# Patient Record
Sex: Male | Born: 1960 | Race: White | Hispanic: No | Marital: Married | Smoking: Never smoker
Health system: Southern US, Community
[De-identification: ages and names within clinical notes are randomized; demographics above are authoritative.]

## PROBLEM LIST (undated history)

## (undated) DIAGNOSIS — F32A Depression, unspecified: Secondary | ICD-10-CM

## (undated) DIAGNOSIS — M479 Spondylosis, unspecified: Secondary | ICD-10-CM

## (undated) DIAGNOSIS — M1990 Unspecified osteoarthritis, unspecified site: Secondary | ICD-10-CM

## (undated) DIAGNOSIS — F419 Anxiety disorder, unspecified: Secondary | ICD-10-CM

## (undated) DIAGNOSIS — K219 Gastro-esophageal reflux disease without esophagitis: Secondary | ICD-10-CM

## (undated) DIAGNOSIS — G5620 Lesion of ulnar nerve, unspecified upper limb: Secondary | ICD-10-CM

## (undated) DIAGNOSIS — I10 Essential (primary) hypertension: Secondary | ICD-10-CM

## (undated) DIAGNOSIS — F329 Major depressive disorder, single episode, unspecified: Secondary | ICD-10-CM

## (undated) HISTORY — PX: HERNIA REPAIR: SHX51

## (undated) HISTORY — PX: COLONOSCOPY: SHX174

## (undated) HISTORY — PX: WISDOM TOOTH EXTRACTION: SHX21

## (undated) HISTORY — PX: BACK SURGERY: SHX140

---

## 2013-03-31 ENCOUNTER — Encounter (HOSPITAL_BASED_OUTPATIENT_CLINIC_OR_DEPARTMENT_OTHER): Payer: Self-pay | Admitting: *Deleted

## 2013-03-31 ENCOUNTER — Other Ambulatory Visit: Payer: Self-pay | Admitting: Orthopedic Surgery

## 2013-04-01 NOTE — H&P (Signed)
Jeffrey Cooke is an 52 y.o. male.   CC / Reason for Visit: Left shoulder and upper extremity problems HPI: This patient returns for reevaluation.  He reports that the pain remains anterior in his shoulder and down the biceps.  It was only minimally affected by the subacromial injection.  He continues to have ring and small finger numbness and tingling, worse in the small finger, which is painful, feeling as if there is needles within it.  He is taking hydrocodone 2 tablets every 4-6 hours, ibuprofen 800 mg 3 times a day, and ran out of Neurontin about a week ago.  He saw no change in his symptoms when he was on it versus off of it.  He underwent electrodiagnostic studies with Dr. 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  Presenting history follows:  This patient is a 52 year old male who presents for evaluation of his left upper extremity.  He reports he was in the process of removing a gear box on a piece of equipment when the motor began to fall.  He reached up overhead to catch the motor and felt a sharp pain in his shoulder and neck.  At first he thought he had just pulled a muscle, but over the course of a few days he began to have increasing pain in the shoulder with numbness and tingling in the fingers.  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  He was subsequently evaluated at [facility] on 01-13-13.  He has had ibuprofen 800 mg, but not taken any in the last 2 days.  Over the course of treatment he has also been prescribed Vicodin, Percocet, and Robaxin.  He reports incompatibility with some of those medicines and working.  He has been in a sling but reports that he had some numbness and tingling in the fingers even before using the sling.  He reports that even water running on the hand causes it to feel as if it is burning, regardless of the water temperature.  Past Medical History   Diagnosis Date  . Hypertension   . Anxiety   . Depression   . GERD (gastroesophageal reflux disease)   . Arthritis     hands and shoulders  . 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 Hernia repair      History reviewed. No pertinent family history. Social History:  reports that he has never smoked. His smokeless tobacco use includes Snuff. He reports that he drinks alcohol. He reports that he does not use illicit drugs.  Allergies: No Known Allergies  No prescriptions prior to admission    No results found for this or any previous visit (from the past 48 hour(s)). No results found.  Review of Systems  All other systems reviewed and are negative.    Height 5\' 10"  (1.778 m), weight 108.863 kg (240 lb). Physical Exam  Constitutional:  WD, WN, NAD HEENT:  NCAT, EOMI Neuro/Psych:  Alert & oriented to person, place, and time; appropriate mood & affect Lymphatic: No generalized UE edema or lymphadenopathy Extremities / MSK:  Both UE are normal with respect to appearance, ranges of motion, joint stability, muscle strength/tone, sensation, & perfusion except as otherwise noted:  The patient has reasonably good cervical motion without significant pain.  When he looks to the left and slightly upward he  has some pain that radiates from the base of the neck down into the shoulder.  Shoulder motion itself whether active or passive is much more painful.  Passively he allows forward elevation to 160, actively can hardly get beyond 90 he has a lot of pain and subsequent weakness with resisted external rotation and abduction, not as much with internal rotation.  Even passive movement of the shoulders painful little longer impingement maneuvers.  He has altered sensibility on the volar and dorsal aspects of the hand distal to the wrist creases.  This includes the ring and small finger with appropriate ring finger splitting for the ulnar nerve.   He has good interosseus and thenar strength.  Strong wrist flexion and extension, elbow flexion and extension.  Labs / Xrays:  No radiographic studies obtained today.  Left shoulder x-rays from 01-11-13 are reviewed and are unremarkable.  MRI of left shoulder performed on 02-08-13 reveals some tendinosis of the superior cuff, small partial thickness intrasubstance tear of the distal subscap, with longitudinal split tearing of the intra-articular biceps and mild medial subluxation at the lesser tuberosity.  There is also some thickening of the inferior GH ligament and rotator interval  Assessment: 1.  Left shoulder pain with MRI indication of pathology to the subscapularis tendon, biceps tendon and the bicipital labral root, with some possible adhesive capsulitis 2.  Left ulnar neuropathy, likely as a consequence of the left upper extremity swelling and splinting with the elbow flexed in a sling directly related to his shoulder injury  Plan:  I discussed these findings with him and his nurse case manager, Debby Bud.In consultation, we agreed that his ulnar neuropathy is largely a consequence of his injury, either because the nerve was directly injured in the course of the injury or became subsequently influenced by the upper extremity edema that accompanies shoulder injuries.  In addition, we would recommend that he have surgical decompression of the ulnar nerve performed first, followed by surgical treatment of his shoulder pathology some time 4 to 6 weeks following the elbow surgery.  We recommend that I provide surgical treatment for his ulnar nerve, and Dr. Ave Filter has agreed to assume care for the shoulder pathology.  Together, we can easily handle the issues at the conclusion of his total care, such as declaration of MMI, any PPI that may exist, as well as restrictions, etc.  I would anticipate that the contribution to these matters from the ulnar nerve would be negligible, and most of it would be  referable to the shoulder itself.  Dr. Ave Filter and I both agree that the nerve is likely to deteriorate substantially if the shoulder is operated upon first, as surgery on the shoulder will cause some prolonged upper extremity edema that will exacerbate the condition of the nerve.  I am willing and able to proceed promptly with surgical treatment of the ulnar nerve once authorization from workers compensation is received.  The patient understands this as I spoke with him on the telephone and explained the same issues in detail.  Rian Koon A. 04/01/2013, 11:57 AM

## 2013-04-05 ENCOUNTER — Encounter (HOSPITAL_BASED_OUTPATIENT_CLINIC_OR_DEPARTMENT_OTHER)
Admission: RE | Disposition: A | Discharge status: Home / Self Care | Payer: Self-pay | Source: Ambulatory Visit | Attending: Orthopedic Surgery

## 2013-04-05 ENCOUNTER — Ambulatory Visit (HOSPITAL_BASED_OUTPATIENT_CLINIC_OR_DEPARTMENT_OTHER): Payer: Worker's Compensation | Admitting: Anesthesiology

## 2013-04-05 ENCOUNTER — Encounter (HOSPITAL_BASED_OUTPATIENT_CLINIC_OR_DEPARTMENT_OTHER): Payer: Worker's Compensation | Admitting: Anesthesiology

## 2013-04-05 ENCOUNTER — Encounter (HOSPITAL_BASED_OUTPATIENT_CLINIC_OR_DEPARTMENT_OTHER): Payer: Self-pay | Admitting: *Deleted

## 2013-04-05 ENCOUNTER — Ambulatory Visit (HOSPITAL_BASED_OUTPATIENT_CLINIC_OR_DEPARTMENT_OTHER)
Admission: RE | Admit: 2013-04-05 | Discharge: 2013-04-05 | Disposition: A | Discharge status: Home / Self Care | Payer: Worker's Compensation | Source: Ambulatory Visit | Attending: Orthopedic Surgery | Admitting: Orthopedic Surgery

## 2013-04-05 DIAGNOSIS — F3289 Other specified depressive episodes: Secondary | ICD-10-CM | POA: Insufficient documentation

## 2013-04-05 DIAGNOSIS — I10 Essential (primary) hypertension: Secondary | ICD-10-CM | POA: Insufficient documentation

## 2013-04-05 DIAGNOSIS — F329 Major depressive disorder, single episode, unspecified: Secondary | ICD-10-CM | POA: Insufficient documentation

## 2013-04-05 DIAGNOSIS — K219 Gastro-esophageal reflux disease without esophagitis: Secondary | ICD-10-CM | POA: Insufficient documentation

## 2013-04-05 DIAGNOSIS — F17200 Nicotine dependence, unspecified, uncomplicated: Secondary | ICD-10-CM | POA: Insufficient documentation

## 2013-04-05 DIAGNOSIS — F411 Generalized anxiety disorder: Secondary | ICD-10-CM | POA: Insufficient documentation

## 2013-04-05 DIAGNOSIS — G5680 Other specified mononeuropathies of unspecified upper limb: Secondary | ICD-10-CM | POA: Insufficient documentation

## 2013-04-05 DIAGNOSIS — G5620 Lesion of ulnar nerve, unspecified upper limb: Secondary | ICD-10-CM | POA: Insufficient documentation

## 2013-04-05 HISTORY — DX: Essential (primary) hypertension: I10

## 2013-04-05 HISTORY — DX: Major depressive disorder, single episode, unspecified: F32.9

## 2013-04-05 HISTORY — DX: Anxiety disorder, unspecified: F41.9

## 2013-04-05 HISTORY — DX: Lesion of ulnar nerve, unspecified upper limb: G56.20

## 2013-04-05 HISTORY — DX: Gastro-esophageal reflux disease without esophagitis: K21.9

## 2013-04-05 HISTORY — PX: ULNAR NERVE TRANSPOSITION: SHX2595

## 2013-04-05 HISTORY — DX: Depression, unspecified: F32.A

## 2013-04-05 HISTORY — DX: Unspecified osteoarthritis, unspecified site: M19.90

## 2013-04-05 LAB — POCT I-STAT, CHEM 8
Creatinine, Ser: 0.9 mg/dL (ref 0.50–1.35)
Glucose, Bld: 95 mg/dL (ref 70–99)
HCT: 46 % (ref 39.0–52.0)
Hemoglobin: 15.6 g/dL (ref 13.0–17.0)
Potassium: 3.7 mEq/L (ref 3.5–5.1)
TCO2: 24 mmol/L (ref 0–100)

## 2013-04-05 SURGERY — ULNAR NERVE DECOMPRESSION/TRANSPOSITION
Anesthesia: General | Site: Elbow | Laterality: Left | Wound class: Clean

## 2013-04-05 MED ORDER — FENTANYL CITRATE 0.05 MG/ML IJ SOLN
INTRAMUSCULAR | Status: AC
  Filled 2013-04-05: qty 4

## 2013-04-05 MED ORDER — ONDANSETRON HCL 4 MG/2ML IJ SOLN: 4.0000 mg | Freq: Once | INTRAMUSCULAR | Status: DC | PRN

## 2013-04-05 MED ORDER — KETOROLAC TROMETHAMINE 30 MG/ML IJ SOLN: 15.0000 mg | Freq: Once | INTRAMUSCULAR | Status: DC | PRN

## 2013-04-05 MED ORDER — OXYCODONE-ACETAMINOPHEN 5-325 MG PO TABS: 1.0000 | ORAL_TABLET | ORAL | Status: DC | PRN

## 2013-04-05 MED ORDER — CEFAZOLIN SODIUM-DEXTROSE 2-3 GM-% IV SOLR
2.0000 g | INTRAVENOUS | Status: AC
  Administered 2013-04-05: 2 g via INTRAVENOUS

## 2013-04-05 MED ORDER — FENTANYL CITRATE 0.05 MG/ML IJ SOLN
INTRAMUSCULAR | Status: DC | PRN
  Administered 2013-04-05: 100 ug via INTRAVENOUS
  Administered 2013-04-05 (×2): 50 ug via INTRAVENOUS

## 2013-04-05 MED ORDER — LIDOCAINE HCL (CARDIAC) 20 MG/ML IV SOLN
INTRAVENOUS | Status: DC | PRN
  Administered 2013-04-05: 40 mg via INTRAVENOUS

## 2013-04-05 MED ORDER — BUPIVACAINE-EPINEPHRINE PF 0.5-1:200000 % IJ SOLN
INTRAMUSCULAR | Status: AC
  Filled 2013-04-05: qty 30

## 2013-04-05 MED ORDER — LACTATED RINGERS IV SOLN
INTRAVENOUS | Status: DC
  Administered 2013-04-05 (×2): via INTRAVENOUS

## 2013-04-05 MED ORDER — PROPOFOL 10 MG/ML IV BOLUS
INTRAVENOUS | Status: DC | PRN
  Administered 2013-04-05: 180 mg via INTRAVENOUS

## 2013-04-05 MED ORDER — HYDROMORPHONE HCL PF 1 MG/ML IJ SOLN
INTRAMUSCULAR | Status: AC
  Filled 2013-04-05: qty 1

## 2013-04-05 MED ORDER — LACTATED RINGERS IV SOLN
INTRAVENOUS | Status: DC
  Administered 2013-04-05: 08:00:00 via INTRAVENOUS

## 2013-04-05 MED ORDER — ONDANSETRON HCL 4 MG/2ML IJ SOLN
INTRAMUSCULAR | Status: DC | PRN
  Administered 2013-04-05: 4 mg via INTRAVENOUS

## 2013-04-05 MED ORDER — 0.9 % SODIUM CHLORIDE (POUR BTL) OPTIME
TOPICAL | Status: DC | PRN
  Administered 2013-04-05: 300 mL

## 2013-04-05 MED ORDER — MIDAZOLAM HCL 5 MG/5ML IJ SOLN
INTRAMUSCULAR | Status: DC | PRN
  Administered 2013-04-05: 2 mg via INTRAVENOUS

## 2013-04-05 MED ORDER — FENTANYL CITRATE 0.05 MG/ML IJ SOLN: 50.0000 ug | INTRAMUSCULAR | Status: DC | PRN

## 2013-04-05 MED ORDER — MIDAZOLAM HCL 2 MG/2ML IJ SOLN: 1.0000 mg | INTRAMUSCULAR | Status: DC | PRN

## 2013-04-05 MED ORDER — MIDAZOLAM HCL 2 MG/2ML IJ SOLN
INTRAMUSCULAR | Status: AC
  Filled 2013-04-05: qty 2

## 2013-04-05 MED ORDER — BUPIVACAINE-EPINEPHRINE PF 0.5-1:200000 % IJ SOLN
INTRAMUSCULAR | Status: DC | PRN
  Administered 2013-04-05: 20 mL

## 2013-04-05 MED ORDER — HYDROMORPHONE HCL PF 1 MG/ML IJ SOLN
0.2500 mg | INTRAMUSCULAR | Status: DC | PRN
  Administered 2013-04-05 (×2): 0.5 mg via INTRAVENOUS

## 2013-04-05 MED ORDER — DEXAMETHASONE SODIUM PHOSPHATE 10 MG/ML IJ SOLN
INTRAMUSCULAR | Status: DC | PRN
  Administered 2013-04-05: 5 mg via INTRAVENOUS

## 2013-04-05 SURGICAL SUPPLY — 48 items
BANDAGE GAUZE ELAST BULKY 4 IN (GAUZE/BANDAGES/DRESSINGS) ×4 IMPLANT
BLADE MINI RND TIP GREEN BEAV (BLADE) IMPLANT
BLADE SURG 15 STRL LF DISP TIS (BLADE) ×1 IMPLANT
BLADE SURG 15 STRL SS (BLADE) ×1
BNDG COHESIVE 4X5 TAN STRL (GAUZE/BANDAGES/DRESSINGS) ×2 IMPLANT
BNDG ESMARK 4X9 LF (GAUZE/BANDAGES/DRESSINGS) ×2 IMPLANT
CHLORAPREP W/TINT 26ML (MISCELLANEOUS) ×2 IMPLANT
CORDS BIPOLAR (ELECTRODE) IMPLANT
COVER MAYO STAND STRL (DRAPES) ×2 IMPLANT
COVER TABLE BACK 60X90 (DRAPES) ×2 IMPLANT
CUFF TOURNIQUET SINGLE 18IN (TOURNIQUET CUFF) ×2 IMPLANT
DRAIN PENROSE 1/2X12 LTX STRL (WOUND CARE) IMPLANT
DRAIN PENROSE 1/4X12 LTX STRL (WOUND CARE) IMPLANT
DRAPE EXTREMITY T 121X128X90 (DRAPE) ×2 IMPLANT
DRAPE SURG 17X23 STRL (DRAPES) ×4 IMPLANT
DRAPE U-SHAPE 47X51 STRL (DRAPES) IMPLANT
DRSG EMULSION OIL 3X3 NADH (GAUZE/BANDAGES/DRESSINGS) ×2 IMPLANT
ELECT REM PT RETURN 9FT ADLT (ELECTROSURGICAL) ×2
ELECTRODE REM PT RTRN 9FT ADLT (ELECTROSURGICAL) ×1 IMPLANT
GLOVE BIO SURGEON STRL SZ 6.5 (GLOVE) ×2 IMPLANT
GLOVE BIO SURGEON STRL SZ7.5 (GLOVE) ×2 IMPLANT
GLOVE BIOGEL PI IND STRL 7.0 (GLOVE) ×1 IMPLANT
GLOVE BIOGEL PI IND STRL 8 (GLOVE) ×1 IMPLANT
GLOVE BIOGEL PI INDICATOR 7.0 (GLOVE) ×1
GLOVE BIOGEL PI INDICATOR 8 (GLOVE) ×1
GLOVE EXAM NITRILE MD LF STRL (GLOVE) ×2 IMPLANT
GOWN PREVENTION PLUS XLARGE (GOWN DISPOSABLE) ×4 IMPLANT
LOOP VESSEL MAXI BLUE (MISCELLANEOUS) IMPLANT
NEEDLE HYPO 25X1 1.5 SAFETY (NEEDLE) ×2 IMPLANT
NS IRRIG 1000ML POUR BTL (IV SOLUTION) ×2 IMPLANT
PACK BASIN DAY SURGERY FS (CUSTOM PROCEDURE TRAY) ×2 IMPLANT
PADDING CAST ABS 4INX4YD NS (CAST SUPPLIES) ×2
PADDING CAST ABS COTTON 4X4 ST (CAST SUPPLIES) ×2 IMPLANT
PENCIL BUTTON HOLSTER BLD 10FT (ELECTRODE) ×2 IMPLANT
RUBBERBAND STERILE (MISCELLANEOUS) IMPLANT
SLING ARM FOAM STRAP LRG (SOFTGOODS) ×2 IMPLANT
SPONGE GAUZE 4X4 12PLY (GAUZE/BANDAGES/DRESSINGS) ×2 IMPLANT
STOCKINETTE 4X48 STRL (DRAPES) ×2 IMPLANT
SUT ETHILON 8 0 BV130 4 (SUTURE) IMPLANT
SUT VIC AB 0 SH 27 (SUTURE) IMPLANT
SUT VIC AB 2-0 SH 27 (SUTURE)
SUT VIC AB 2-0 SH 27XBRD (SUTURE) IMPLANT
SUT VICRYL RAPIDE 4/0 PS 2 (SUTURE) ×2 IMPLANT
SYR BULB 3OZ (MISCELLANEOUS) ×2 IMPLANT
SYRINGE 10CC LL (SYRINGE) ×2 IMPLANT
TOWEL OR 17X24 6PK STRL BLUE (TOWEL DISPOSABLE) ×2 IMPLANT
TOWEL OR NON WOVEN STRL DISP B (DISPOSABLE) ×2 IMPLANT
UNDERPAD 30X30 INCONTINENT (UNDERPADS AND DIAPERS) ×2 IMPLANT

## 2013-04-05 NOTE — Anesthesia Procedure Notes (Signed)
Procedure Name: LMA Insertion Date/Time: 04/05/2013 8:57 AM Performed by: Burna Cash Pre-anesthesia Checklist: Patient identified, Emergency Drugs available, Suction available and Patient being monitored Patient Re-evaluated:Patient Re-evaluated prior to inductionOxygen Delivery Method: Circle System Utilized Preoxygenation: Pre-oxygenation with 100% oxygen Intubation Type: IV induction Ventilation: Mask ventilation without difficulty LMA: LMA inserted LMA Size: 5.0 Number of attempts: 1 Airway Equipment and Method: bite block Placement Confirmation: positive ETCO2 Tube secured with: Tape Dental Injury: Teeth and Oropharynx as per pre-operative assessment

## 2013-04-05 NOTE — Transfer of Care (Signed)
Immediate Anesthesia Transfer of Care Note  Patient: Jeffrey Cooke  Procedure(s) Performed: Procedure(s): LEFT ULNAR NEUROPLASTY AT ELBOW  (Left)  Patient Location: PACU  Anesthesia Type:General  Level of Consciousness: awake, alert  and oriented  Airway & Oxygen Therapy: Patient Spontanous Breathing and Patient connected to face mask oxygen  Post-op Assessment: Report given to PACU RN and Post -op Vital signs reviewed and stable  Post vital signs: Reviewed and stable  Complications: No apparent anesthesia complications

## 2013-04-05 NOTE — Anesthesia Postprocedure Evaluation (Signed)
  Anesthesia Post-op Note  Patient: Jeffrey Cooke  Procedure(s) Performed: Procedure(s): LEFT ULNAR NEUROPLASTY AT ELBOW  (Left)  Patient Location: PACU  Anesthesia Type:General  Level of Consciousness: awake, alert  and oriented  Airway and Oxygen Therapy: Patient Spontanous Breathing  Post-op Pain: mild  Post-op Assessment: Post-op Vital signs reviewed, Patient's Cardiovascular Status Stable, Respiratory Function Stable, Patent Airway and Pain level controlled  Post-op Vital Signs: stable  Complications: No apparent anesthesia complications

## 2013-04-05 NOTE — Op Note (Signed)
04/05/2013  8:42 AM  PATIENT:  Jeffrey Cooke  52 y.o. male  PRE-OPERATIVE DIAGNOSIS:  Left ulnar neuropathy at the elbow  POST-OPERATIVE DIAGNOSIS:  Same  PROCEDURE:  Left ulnar neuroplasty at the elbow (decompression in situ)  SURGEON: Cliffton Asters. Janee Morn, MD  PHYSICIAN ASSISTANT: None  ANESTHESIA:  general  SPECIMENS:  None  DRAINS:   None  PREOPERATIVE INDICATIONS:  Jeffrey Cooke is a  52 y.o. male with a diagnosis of left ulnar neuropathy at the elbow who failed conservative measures and elected for surgical management.    The risks benefits and alternatives were discussed with the patient preoperatively including but not limited to the risks of infection, bleeding, nerve injury, cardiopulmonary complications, the need for revision surgery, among others, and the patient verbalized understanding and consented to proceed.  OPERATIVE IMPLANTS: None  OPERATIVE PROCEDURE:  After receiving prophylactic antibiotics, the patient was escorted to the operative theatre and placed in a supine position. General anesthesia was administered A surgical "time-out" was performed during which the planned procedure, proposed operative site, and the correct patient identity were compared to the operative consent and agreement confirmed by the circulating nurse according to current facility policy. The exposed skin was prepped with Chloraprep and draped in the usual sterile fashion.  A sterile tourniquet was applied. The limb was exsanguinated with an Esmarch bandage and the tourniquet inflated to approximately higher than systolic BP.  Curvilinear incision was marked and made over the ulnar nerve at the level of the elbow, with the incision placed between the prominence of the medial epicondyle and the olecranon. The skin was incised sharply with scalpel. Subcutaneous tissues were dissected with blunt spreading dissection. Full-thickness dissection was performed down to the fascia directly over the  cubital tunnel, the proximal and distal to this full-thickness elevation the skin and subcutaneous tissues to include crossing cutaneous nerves was performed. The nerve was identified and the cubital tunnel incised. Osborne's ligament was split along its posterior edge to help provide restraint for nerve movement. The fascial layers ulnar nerve wasn't split proximal and distal for about 10 cm. The proximal side, though still a crossing band across the nerve was crossed from the anterior compartment of the posterior. A separate incision of about an inch was made over the medial upper arm just distal to the tourniquet to allow for decompression of the nerve at that level. Distally the FCU fascia both superficial and deep were split protecting the muscular branches from the ulnar nerve. The nerve was cycled through flexion extension and found to be stable. Tourniquet was released and additional hemostasis obtained and the wound is copiously irrigated. The edges of the incisions were infiltrated with half percent Marcaine with epinephrine, total 20 mL, and then the incisions were closed with 3-0 Vicryl deep dermal buried sutures followed by running 4-0 Vicryl Rapide horizontal mattress suture. A bulky dressing was applied from the palm to the axilla and the patient was awakened and taken to room stable condition breathing spontaneously  DISPOSITION: Patient discharged home today returning in 10-15 days for reevaluation.

## 2013-04-05 NOTE — Anesthesia Preprocedure Evaluation (Signed)
Anesthesia Evaluation  Patient identified by MRN, date of birth, ID band Patient awake    Reviewed: Allergy & Precautions, H&P , NPO status   Airway Mallampati: II TM Distance: >3 FB Neck ROM: Full    Dental  (+) Teeth Intact and Dental Advisory Given   Pulmonary  breath sounds clear to auscultation        Cardiovascular Rhythm:Regular Rate:Normal     Neuro/Psych    GI/Hepatic   Endo/Other    Renal/GU      Musculoskeletal   Abdominal   Peds  Hematology   Anesthesia Other Findings   Reproductive/Obstetrics                           Anesthesia Physical Anesthesia Plan  ASA: II  Anesthesia Plan: General   Post-op Pain Management:    Induction: Intravenous  Airway Management Planned: LMA  Additional Equipment:   Intra-op Plan:   Post-operative Plan:   Informed Consent: I have reviewed the patients History and Physical, chart, labs and discussed the procedure including the risks, benefits and alternatives for the proposed anesthesia with the patient or authorized representative who has indicated his/her understanding and acceptance.   Dental advisory given  Plan Discussed with: CRNA and Anesthesiologist  Anesthesia Plan Comments:         Anesthesia Quick Evaluation

## 2013-04-05 NOTE — Interval H&P Note (Signed)
History and Physical Interval Note:  04/05/2013 8:42 AM  Jeffrey Cooke  has presented today for surgery, with the diagnosis of LEFT ULNAR NEUROPATHY   The various methods of treatment have been discussed with the patient and family. After consideration of risks, benefits and other options for treatment, the patient has consented to  Procedure(s): LEFT ULNAR NEUROPLASTY AT ELBOW  (Left) as a surgical intervention .  The patient's history has been reviewed, patient examined, no change in status, stable for surgery.  I have reviewed the patient's chart and labs.  Questions were answered to the patient's satisfaction.     [name]

## 2013-04-06 ENCOUNTER — Encounter (HOSPITAL_BASED_OUTPATIENT_CLINIC_OR_DEPARTMENT_OTHER): Payer: Self-pay | Admitting: Orthopedic Surgery

## 2016-08-15 ENCOUNTER — Encounter (HOSPITAL_COMMUNITY): Payer: Self-pay | Admitting: *Deleted

## 2016-08-15 ENCOUNTER — Other Ambulatory Visit: Payer: Self-pay | Admitting: Neurosurgery

## 2016-08-15 NOTE — Progress Notes (Signed)
Pt denies SOB, chest pain, and being under the care of a cardiologist. Pt denies having a cardiac cath. Pt denies having an EKG and chest x ray within the last year. Pt denies having recent labs. Pt made aware to stop taking Aspirin, vitamins, fish oil and herbal medications. Do not take any NSAIDs ie: Ibuprofen, Advil, Naproxen, BC and Goody Powder or any medication containing Aspirin. Pt verbalized understanding of all pre-op instructions.

## 2016-08-16 ENCOUNTER — Encounter (HOSPITAL_COMMUNITY)
Admission: RE | Disposition: A | Discharge status: Home / Self Care | Payer: Self-pay | Source: Ambulatory Visit | Attending: Neurosurgery

## 2016-08-16 ENCOUNTER — Ambulatory Visit (HOSPITAL_COMMUNITY): Payer: BLUE CROSS/BLUE SHIELD | Admitting: Anesthesiology

## 2016-08-16 ENCOUNTER — Ambulatory Visit (HOSPITAL_COMMUNITY): Payer: BLUE CROSS/BLUE SHIELD

## 2016-08-16 ENCOUNTER — Ambulatory Visit (HOSPITAL_COMMUNITY)
Admission: RE | Admit: 2016-08-16 | Discharge: 2016-08-16 | Disposition: A | Discharge status: Home / Self Care | Payer: BLUE CROSS/BLUE SHIELD | Source: Ambulatory Visit | Attending: Neurosurgery | Admitting: Neurosurgery

## 2016-08-16 ENCOUNTER — Encounter (HOSPITAL_COMMUNITY): Payer: Self-pay | Admitting: Surgery

## 2016-08-16 DIAGNOSIS — M4722 Other spondylosis with radiculopathy, cervical region: Secondary | ICD-10-CM | POA: Insufficient documentation

## 2016-08-16 DIAGNOSIS — F329 Major depressive disorder, single episode, unspecified: Secondary | ICD-10-CM | POA: Diagnosis not present

## 2016-08-16 DIAGNOSIS — F419 Anxiety disorder, unspecified: Secondary | ICD-10-CM | POA: Diagnosis not present

## 2016-08-16 DIAGNOSIS — Z419 Encounter for procedure for purposes other than remedying health state, unspecified: Secondary | ICD-10-CM

## 2016-08-16 DIAGNOSIS — M19011 Primary osteoarthritis, right shoulder: Secondary | ICD-10-CM | POA: Insufficient documentation

## 2016-08-16 DIAGNOSIS — M50123 Cervical disc disorder at C6-C7 level with radiculopathy: Secondary | ICD-10-CM | POA: Insufficient documentation

## 2016-08-16 DIAGNOSIS — M19041 Primary osteoarthritis, right hand: Secondary | ICD-10-CM | POA: Insufficient documentation

## 2016-08-16 DIAGNOSIS — F17290 Nicotine dependence, other tobacco product, uncomplicated: Secondary | ICD-10-CM | POA: Diagnosis not present

## 2016-08-16 DIAGNOSIS — M19012 Primary osteoarthritis, left shoulder: Secondary | ICD-10-CM | POA: Insufficient documentation

## 2016-08-16 DIAGNOSIS — I10 Essential (primary) hypertension: Secondary | ICD-10-CM | POA: Diagnosis not present

## 2016-08-16 DIAGNOSIS — M19042 Primary osteoarthritis, left hand: Secondary | ICD-10-CM | POA: Diagnosis not present

## 2016-08-16 DIAGNOSIS — K219 Gastro-esophageal reflux disease without esophagitis: Secondary | ICD-10-CM | POA: Insufficient documentation

## 2016-08-16 DIAGNOSIS — M5020 Other cervical disc displacement, unspecified cervical region: Secondary | ICD-10-CM | POA: Diagnosis present

## 2016-08-16 HISTORY — PX: ANTERIOR CERVICAL DECOMP/DISCECTOMY FUSION: SHX1161

## 2016-08-16 HISTORY — DX: Spondylosis, unspecified: M47.9

## 2016-08-16 LAB — CBC
HCT: 46 % (ref 39.0–52.0)
Hemoglobin: 15.1 g/dL (ref 13.0–17.0)
MCH: 30.1 pg (ref 26.0–34.0)
MCHC: 32.8 g/dL (ref 30.0–36.0)
MCV: 91.6 fL (ref 78.0–100.0)
PLATELETS: 318 10*3/uL (ref 150–400)
RBC: 5.02 MIL/uL (ref 4.22–5.81)
RDW: 12.6 % (ref 11.5–15.5)
WBC: 8.3 10*3/uL (ref 4.0–10.5)

## 2016-08-16 LAB — ABO/RH: ABO/RH(D): B POS

## 2016-08-16 LAB — BASIC METABOLIC PANEL
ANION GAP: 9 (ref 5–15)
BUN: 10 mg/dL (ref 6–20)
CO2: 24 mmol/L (ref 22–32)
Calcium: 9 mg/dL (ref 8.9–10.3)
Chloride: 105 mmol/L (ref 101–111)
Creatinine, Ser: 0.79 mg/dL (ref 0.61–1.24)
GFR calc Af Amer: >60 mL/min (ref >60)
Glucose, Bld: 90 mg/dL (ref 65–99)
POTASSIUM: 4 mmol/L (ref 3.5–5.1)
SODIUM: 138 mmol/L (ref 135–145)

## 2016-08-16 LAB — SURGICAL PCR SCREEN
MRSA, PCR: NEGATIVE
Staphylococcus aureus: POSITIVE — AB

## 2016-08-16 LAB — TYPE AND SCREEN
ABO/RH(D): B POS
Antibody Screen: NEGATIVE

## 2016-08-16 SURGERY — ANTERIOR CERVICAL DECOMPRESSION/DISCECTOMY FUSION 2 LEVELS
Anesthesia: General

## 2016-08-16 MED ORDER — SODIUM CHLORIDE 0.9% FLUSH: 3.0000 mL | INTRAVENOUS | Status: DC | PRN

## 2016-08-16 MED ORDER — MIDAZOLAM HCL 5 MG/5ML IJ SOLN
INTRAMUSCULAR | Status: DC | PRN
  Administered 2016-08-16: 2 mg via INTRAVENOUS

## 2016-08-16 MED ORDER — CYCLOBENZAPRINE HCL 10 MG PO TABS
10.0000 mg | ORAL_TABLET | Freq: Three times a day (TID) | ORAL | Status: DC
  Administered 2016-08-16: 10 mg via ORAL
  Filled 2016-08-16: qty 1

## 2016-08-16 MED ORDER — ACETAMINOPHEN 325 MG PO TABS: 650.0000 mg | ORAL_TABLET | ORAL | Status: DC | PRN

## 2016-08-16 MED ORDER — ROCURONIUM BROMIDE 100 MG/10ML IV SOLN
INTRAVENOUS | Status: DC | PRN
  Administered 2016-08-16: 50 mg via INTRAVENOUS
  Administered 2016-08-16: 10 mg via INTRAVENOUS

## 2016-08-16 MED ORDER — LISINOPRIL 20 MG PO TABS: 20.0000 mg | ORAL_TABLET | Freq: Every day | ORAL | Status: DC

## 2016-08-16 MED ORDER — HYDROCODONE-ACETAMINOPHEN 5-325 MG PO TABS
1.0000 | ORAL_TABLET | ORAL | Status: DC | PRN
  Administered 2016-08-16: 2 via ORAL
  Filled 2016-08-16: qty 2

## 2016-08-16 MED ORDER — LACTATED RINGERS IV SOLN
INTRAVENOUS | Status: DC
  Administered 2016-08-16 (×2): via INTRAVENOUS

## 2016-08-16 MED ORDER — PROMETHAZINE HCL 25 MG/ML IJ SOLN
6.2500 mg | INTRAMUSCULAR | Status: DC | PRN
  Administered 2016-08-16: 12.5 mg via INTRAVENOUS

## 2016-08-16 MED ORDER — HEMOSTATIC AGENTS (NO CHARGE) OPTIME
TOPICAL | Status: DC | PRN
Start: 2016-08-16 — End: 2016-08-16
  Administered 2016-08-16: 1 via TOPICAL

## 2016-08-16 MED ORDER — THROMBIN 5000 UNITS EX SOLR
CUTANEOUS | Status: AC
  Filled 2016-08-16: qty 10000

## 2016-08-16 MED ORDER — MENTHOL 3 MG MT LOZG: 1.0000 | LOZENGE | OROMUCOSAL | Status: DC | PRN

## 2016-08-16 MED ORDER — LIDOCAINE-EPINEPHRINE 2 %-1:100000 IJ SOLN
INTRAMUSCULAR | Status: DC | PRN
Start: 2016-08-16 — End: 2016-08-16
  Administered 2016-08-16: 4 mL via INTRADERMAL

## 2016-08-16 MED ORDER — MORPHINE SULFATE (PF) 4 MG/ML IV SOLN: 2.0000 mg | INTRAVENOUS | Status: DC | PRN

## 2016-08-16 MED ORDER — SUCCINYLCHOLINE CHLORIDE 200 MG/10ML IV SOSY
PREFILLED_SYRINGE | INTRAVENOUS | Status: AC
  Filled 2016-08-16: qty 10

## 2016-08-16 MED ORDER — FENTANYL CITRATE (PF) 100 MCG/2ML IJ SOLN
INTRAMUSCULAR | Status: AC
  Filled 2016-08-16: qty 4

## 2016-08-16 MED ORDER — ONDANSETRON HCL 4 MG/2ML IJ SOLN
INTRAMUSCULAR | Status: DC | PRN
  Administered 2016-08-16: 4 mg via INTRAVENOUS

## 2016-08-16 MED ORDER — ACETAMINOPHEN 650 MG RE SUPP: 650.0000 mg | RECTAL | Status: DC | PRN

## 2016-08-16 MED ORDER — PANTOPRAZOLE SODIUM 40 MG PO TBEC: 80.0000 mg | DELAYED_RELEASE_TABLET | Freq: Every day | ORAL | Status: DC

## 2016-08-16 MED ORDER — FENTANYL CITRATE (PF) 100 MCG/2ML IJ SOLN
INTRAMUSCULAR | Status: AC
  Filled 2016-08-16: qty 2

## 2016-08-16 MED ORDER — EPHEDRINE 5 MG/ML INJ
INTRAVENOUS | Status: AC
  Filled 2016-08-16: qty 10

## 2016-08-16 MED ORDER — MORPHINE SULFATE (PF) 2 MG/ML IV SOLN: 2.0000 mg | INTRAVENOUS | Status: DC | PRN

## 2016-08-16 MED ORDER — HYDROCODONE-ACETAMINOPHEN 5-325 MG PO TABS: 1.0000 | ORAL_TABLET | Freq: Four times a day (QID) | ORAL | 0 refills | Status: AC | PRN

## 2016-08-16 MED ORDER — PHENOL 1.4 % MT LIQD: 1.0000 | OROMUCOSAL | Status: DC | PRN

## 2016-08-16 MED ORDER — 0.9 % SODIUM CHLORIDE (POUR BTL) OPTIME
TOPICAL | Status: DC | PRN
  Administered 2016-08-16: 1000 mL

## 2016-08-16 MED ORDER — MIDAZOLAM HCL 2 MG/2ML IJ SOLN
INTRAMUSCULAR | Status: AC
  Filled 2016-08-16: qty 2

## 2016-08-16 MED ORDER — FENTANYL CITRATE (PF) 100 MCG/2ML IJ SOLN
INTRAMUSCULAR | Status: DC | PRN
  Administered 2016-08-16 (×8): 50 ug via INTRAVENOUS

## 2016-08-16 MED ORDER — LIDOCAINE HCL 4 % MT SOLN
OROMUCOSAL | Status: DC | PRN
  Administered 2016-08-16: 2 mL via TOPICAL

## 2016-08-16 MED ORDER — POTASSIUM CHLORIDE IN NACL 20-0.9 MEQ/L-% IV SOLN: INTRAVENOUS | Status: DC

## 2016-08-16 MED ORDER — SODIUM CHLORIDE 0.9 % IV SOLN: 250.0000 mL | INTRAVENOUS | Status: DC

## 2016-08-16 MED ORDER — HYDROMORPHONE HCL 1 MG/ML IJ SOLN
INTRAMUSCULAR | Status: AC
  Filled 2016-08-16: qty 1

## 2016-08-16 MED ORDER — CEFAZOLIN SODIUM-DEXTROSE 2-4 GM/100ML-% IV SOLN
INTRAVENOUS | Status: AC
  Filled 2016-08-16: qty 100

## 2016-08-16 MED ORDER — LIDOCAINE-EPINEPHRINE 2 %-1:100000 IJ SOLN
INTRAMUSCULAR | Status: AC
  Filled 2016-08-16: qty 1

## 2016-08-16 MED ORDER — PROPOFOL 10 MG/ML IV BOLUS
INTRAVENOUS | Status: AC
  Filled 2016-08-16: qty 20

## 2016-08-16 MED ORDER — AMLODIPINE BESYLATE 10 MG PO TABS
10.0000 mg | ORAL_TABLET | Freq: Every day | ORAL | Status: DC
  Filled 2016-08-16: qty 1

## 2016-08-16 MED ORDER — CEFAZOLIN SODIUM-DEXTROSE 2-3 GM-% IV SOLR
INTRAVENOUS | Status: DC | PRN
  Administered 2016-08-16: 2 g via INTRAVENOUS

## 2016-08-16 MED ORDER — DEXTROSE 5 % IV SOLN
INTRAVENOUS | Status: DC | PRN
  Administered 2016-08-16: 40 ug/min via INTRAVENOUS

## 2016-08-16 MED ORDER — PROMETHAZINE HCL 25 MG/ML IJ SOLN
INTRAMUSCULAR | Status: AC
  Administered 2016-08-16: 12.5 mg via INTRAVENOUS
  Filled 2016-08-16: qty 1

## 2016-08-16 MED ORDER — ZOLPIDEM TARTRATE 5 MG PO TABS: 5.0000 mg | ORAL_TABLET | Freq: Every evening | ORAL | Status: DC | PRN

## 2016-08-16 MED ORDER — ONDANSETRON HCL 4 MG/2ML IJ SOLN
INTRAMUSCULAR | Status: AC
  Filled 2016-08-16: qty 2

## 2016-08-16 MED ORDER — LIDOCAINE HCL (CARDIAC) 20 MG/ML IV SOLN
INTRAVENOUS | Status: DC | PRN
  Administered 2016-08-16: 60 mg via INTRAVENOUS

## 2016-08-16 MED ORDER — MUPIROCIN 2 % EX OINT
1.0000 "application " | TOPICAL_OINTMENT | Freq: Once | CUTANEOUS | Status: AC
  Administered 2016-08-16: 1 via TOPICAL

## 2016-08-16 MED ORDER — SODIUM CHLORIDE 0.9% FLUSH: 3.0000 mL | Freq: Two times a day (BID) | INTRAVENOUS | Status: DC

## 2016-08-16 MED ORDER — HYDROMORPHONE HCL 1 MG/ML IJ SOLN
0.2500 mg | INTRAMUSCULAR | Status: DC | PRN
  Administered 2016-08-16: 0.5 mg via INTRAVENOUS

## 2016-08-16 MED ORDER — PROPOFOL 10 MG/ML IV BOLUS
INTRAVENOUS | Status: DC | PRN
  Administered 2016-08-16: 200 mg via INTRAVENOUS

## 2016-08-16 MED ORDER — ACETAMINOPHEN 500 MG PO TABS: 1000.0000 mg | ORAL_TABLET | Freq: Four times a day (QID) | ORAL | Status: DC

## 2016-08-16 MED ORDER — ONDANSETRON HCL 4 MG PO TABS: 4.0000 mg | ORAL_TABLET | Freq: Four times a day (QID) | ORAL | Status: DC | PRN

## 2016-08-16 MED ORDER — THROMBIN 5000 UNITS EX SOLR
CUTANEOUS | Status: DC | PRN
  Administered 2016-08-16 (×2): 5000 [IU] via TOPICAL

## 2016-08-16 MED ORDER — ONDANSETRON HCL 4 MG/2ML IJ SOLN: 4.0000 mg | Freq: Four times a day (QID) | INTRAMUSCULAR | Status: DC | PRN

## 2016-08-16 MED ORDER — MUPIROCIN 2 % EX OINT
TOPICAL_OINTMENT | CUTANEOUS | Status: AC
  Administered 2016-08-16: 1 via TOPICAL
  Filled 2016-08-16: qty 22

## 2016-08-16 MED ORDER — VITAMIN C 500 MG PO TABS: 500.0000 mg | ORAL_TABLET | Freq: Every day | ORAL | Status: DC

## 2016-08-16 MED ORDER — SUGAMMADEX SODIUM 200 MG/2ML IV SOLN
INTRAVENOUS | Status: DC | PRN
  Administered 2016-08-16: 200 mg via INTRAVENOUS

## 2016-08-16 SURGICAL SUPPLY — 69 items
BNDG GAUZE ELAST 4 BULKY (GAUZE/BANDAGES/DRESSINGS) IMPLANT
BUR DRUM 4.0 (BURR) ×2 IMPLANT
BUR DRUM 4.0MM (BURR) ×1
BUR MATCHSTICK NEURO 3.0 LAGG (BURR) ×3 IMPLANT
CANISTER SUCT 3000ML PPV (MISCELLANEOUS) ×3 IMPLANT
CARTRIDGE OIL MAESTRO DRILL (MISCELLANEOUS) ×1 IMPLANT
DECANTER SPIKE VIAL GLASS SM (MISCELLANEOUS) ×3 IMPLANT
DERMABOND ADVANCED (GAUZE/BANDAGES/DRESSINGS) ×2
DERMABOND ADVANCED .7 DNX12 (GAUZE/BANDAGES/DRESSINGS) ×1 IMPLANT
DIFFUSER DRILL AIR PNEUMATIC (MISCELLANEOUS) ×3 IMPLANT
DRAPE HALF SHEET 40X57 (DRAPES) IMPLANT
DRAPE LAPAROTOMY 100X72 PEDS (DRAPES) ×3 IMPLANT
DRAPE MICROSCOPE LEICA (MISCELLANEOUS) ×3 IMPLANT
DRAPE POUCH INSTRU U-SHP 10X18 (DRAPES) ×3 IMPLANT
DURAPREP 6ML APPLICATOR 50/CS (WOUND CARE) ×3 IMPLANT
ELECT COATED BLADE 2.86 ST (ELECTRODE) ×3 IMPLANT
ELECT REM PT RETURN 9FT ADLT (ELECTROSURGICAL) ×3
ELECTRODE REM PT RTRN 9FT ADLT (ELECTROSURGICAL) ×1 IMPLANT
GAUZE SPONGE 4X4 16PLY XRAY LF (GAUZE/BANDAGES/DRESSINGS) IMPLANT
GLOVE BIO SURGEON STRL SZ 6.5 (GLOVE) IMPLANT
GLOVE BIO SURGEON STRL SZ7 (GLOVE) IMPLANT
GLOVE BIO SURGEON STRL SZ7.5 (GLOVE) IMPLANT
GLOVE BIO SURGEON STRL SZ8 (GLOVE) IMPLANT
GLOVE BIO SURGEON STRL SZ8.5 (GLOVE) IMPLANT
GLOVE BIO SURGEONS STRL SZ 6.5 (GLOVE)
GLOVE BIOGEL M 8.0 STRL (GLOVE) IMPLANT
GLOVE ECLIPSE 6.5 STRL STRAW (GLOVE) ×3 IMPLANT
GLOVE ECLIPSE 7.0 STRL STRAW (GLOVE) IMPLANT
GLOVE ECLIPSE 7.5 STRL STRAW (GLOVE) IMPLANT
GLOVE ECLIPSE 8.0 STRL XLNG CF (GLOVE) IMPLANT
GLOVE ECLIPSE 8.5 STRL (GLOVE) IMPLANT
GLOVE EXAM NITRILE LRG STRL (GLOVE) IMPLANT
GLOVE EXAM NITRILE XL STR (GLOVE) IMPLANT
GLOVE EXAM NITRILE XS STR PU (GLOVE) IMPLANT
GLOVE INDICATOR 6.5 STRL GRN (GLOVE) IMPLANT
GLOVE INDICATOR 7.0 STRL GRN (GLOVE) ×3 IMPLANT
GLOVE INDICATOR 7.5 STRL GRN (GLOVE) ×3 IMPLANT
GLOVE INDICATOR 8.0 STRL GRN (GLOVE) IMPLANT
GLOVE INDICATOR 8.5 STRL (GLOVE) IMPLANT
GLOVE OPTIFIT SS 8.0 STRL (GLOVE) IMPLANT
GLOVE SURG SS PI 6.5 STRL IVOR (GLOVE) ×9 IMPLANT
GOWN STRL REUS W/ TWL LRG LVL3 (GOWN DISPOSABLE) ×3 IMPLANT
GOWN STRL REUS W/ TWL XL LVL3 (GOWN DISPOSABLE) IMPLANT
GOWN STRL REUS W/TWL 2XL LVL3 (GOWN DISPOSABLE) IMPLANT
GOWN STRL REUS W/TWL LRG LVL3 (GOWN DISPOSABLE) ×6
GOWN STRL REUS W/TWL XL LVL3 (GOWN DISPOSABLE)
KIT BASIN OR (CUSTOM PROCEDURE TRAY) ×3 IMPLANT
KIT ROOM TURNOVER OR (KITS) ×3 IMPLANT
NEEDLE HYPO 25X1 1.5 SAFETY (NEEDLE) ×3 IMPLANT
NEEDLE SPNL 22GX3.5 QUINCKE BK (NEEDLE) ×15 IMPLANT
NS IRRIG 1000ML POUR BTL (IV SOLUTION) ×3 IMPLANT
OIL CARTRIDGE MAESTRO DRILL (MISCELLANEOUS) ×3
PACK LAMINECTOMY NEURO (CUSTOM PROCEDURE TRAY) ×3 IMPLANT
PAD ARMBOARD 7.5X6 YLW CONV (MISCELLANEOUS) ×9 IMPLANT
PIN DISTRACTION 14MM (PIN) ×6 IMPLANT
PLATE HELIZ-R 40MM (Plate) ×3 IMPLANT
RUBBERBAND STERILE (MISCELLANEOUS) ×6 IMPLANT
SCREW 4.0X13 (Screw) ×6 IMPLANT
SCREW 4.0X13MM (Screw) ×12 IMPLANT
SPACER ACF PARALLEL 7MM (Bone Implant) ×3 IMPLANT
SPACER CC-ACF 8MM PARALLEL (Bone Implant) ×3 IMPLANT
SPONGE INTESTINAL PEANUT (DISPOSABLE) ×3 IMPLANT
SPONGE SURGIFOAM ABS GEL SZ50 (HEMOSTASIS) ×3 IMPLANT
SUT VIC AB 0 CT1 27 (SUTURE)
SUT VIC AB 0 CT1 27XBRD ANTBC (SUTURE) IMPLANT
SUT VIC AB 3-0 SH 8-18 (SUTURE) ×3 IMPLANT
TOWEL GREEN STERILE (TOWEL DISPOSABLE) ×3 IMPLANT
TOWEL GREEN STERILE FF (TOWEL DISPOSABLE) ×2 IMPLANT
WATER STERILE IRR 1000ML POUR (IV SOLUTION) ×3 IMPLANT

## 2016-08-16 NOTE — Transfer of Care (Signed)
Immediate Anesthesia Transfer of Care Note  Patient: Jeffrey Cooke  Procedure(s) Performed: Procedure(s): ANTERIOR CERVICAL DECOMPRESSION/DISCECTOMY FUSION CERVICAL FIVE- CERVICAL SIX, CERVICAL SIX- CERVICAL SEVEN (N/A)  Patient Location: PACU  Anesthesia Type:General  Level of Consciousness: awake  Airway & Oxygen Therapy: Patient Spontanous Breathing and Patient connected to face mask oxygen  Post-op Assessment: Report given to RN and Post -op Vital signs reviewed and stable  Post vital signs: Reviewed and stable  Last Vitals:  Vitals:   08/16/16 0919 08/16/16 1620  BP: (!) 152/98   Pulse: 82   Resp: 20   Temp: 36.8 C 36.7 C    Last Pain:  Vitals:   08/16/16 1620  TempSrc:   PainSc: 8       Patients Stated Pain Goal: 4 (08/16/16 1620)  Complications: No apparent anesthesia complications

## 2016-08-16 NOTE — Anesthesia Preprocedure Evaluation (Addendum)
Anesthesia Evaluation  Patient identified by MRN, date of birth, ID band Patient awake    Reviewed: Allergy & Precautions, NPO status , Patient's Chart, lab work & pertinent test results  Airway Mallampati: II  TM Distance: >3 FB Neck ROM: Limited    Dental  (+) Dental Advisory Given, Teeth Intact   Pulmonary neg pulmonary ROS,    breath sounds clear to auscultation       Cardiovascular hypertension, Pt. on medications  Rhythm:Regular Rate:Normal     Neuro/Psych Anxiety Depression Cervical radiculopathy    GI/Hepatic Neg liver ROS, GERD  Medicated and Controlled,  Endo/Other  negative endocrine ROS  Renal/GU negative Renal ROS     Musculoskeletal  (+) Arthritis , Osteoarthritis,    Abdominal   Peds  Hematology negative hematology ROS (+)   Anesthesia Other Findings   Reproductive/Obstetrics                           Lab Results  Component Value Date   WBC 8.3 08/16/2016   HGB 15.1 08/16/2016   HCT 46.0 08/16/2016   MCV 91.6 08/16/2016   PLT 318 08/16/2016   Lab Results  Component Value Date   CREATININE 0.90 04/05/2013   BUN 12 04/05/2013   NA 142 04/05/2013   K 3.7 04/05/2013   CL 109 04/05/2013    Anesthesia Physical Anesthesia Plan  ASA: II  Anesthesia Plan: General   Post-op Pain Management:    Induction: Intravenous  Airway Management Planned: Oral ETT and Video Laryngoscope Planned  Additional Equipment:   Intra-op Plan:   Post-operative Plan: Extubation in OR  Informed Consent: I have reviewed the patients History and Physical, chart, labs and discussed the procedure including the risks, benefits and alternatives for the proposed anesthesia with the patient or authorized representative who has indicated his/her understanding and acceptance.   Dental advisory given  Plan Discussed with: Anesthesiologist, CRNA and Surgeon  Anesthesia Plan Comments:         Anesthesia Quick Evaluation

## 2016-08-16 NOTE — Anesthesia Postprocedure Evaluation (Signed)
Anesthesia Post Note  Patient: Jeffrey RamalGary Cooke  Procedure(s) Performed: Procedure(s) (LRB): ANTERIOR CERVICAL DECOMPRESSION/DISCECTOMY FUSION CERVICAL FIVE- CERVICAL SIX, CERVICAL SIX- CERVICAL SEVEN (N/A)  Patient location during evaluation: PACU Anesthesia Type: General Level of consciousness: awake and alert Pain management: pain level controlled Vital Signs Assessment: post-procedure vital signs reviewed and stable Respiratory status: spontaneous breathing, nonlabored ventilation, respiratory function stable and patient connected to nasal cannula oxygen Cardiovascular status: blood pressure returned to baseline and stable Postop Assessment: no signs of nausea or vomiting Anesthetic complications: no       Last Vitals:  Vitals:   08/16/16 1705 08/16/16 1723  BP:    Pulse:  87  Resp:    Temp: 36.4 C 36.8 C    Last Pain:  Vitals:   08/16/16 1730  TempSrc:   PainSc: 5                  [name], [name]

## 2016-08-16 NOTE — Progress Notes (Signed)
Discharged orders/instructions/AVS/education/Rx  Given to patient with wife at  Bedside and they both verbalized understanding. MAE well, ambulated in hallway independently. Tolerated his dinner well. Patient voided with no difficulty. No drainage, no swelling, no redness noted. D/C via wheelchair.

## 2016-08-16 NOTE — Discharge Instructions (Signed)

## 2016-08-16 NOTE — Anesthesia Procedure Notes (Signed)
Procedure Name: Intubation Date/Time: 08/16/2016 1:17 PM Performed by: Carney Living Pre-anesthesia Checklist: Patient identified, Emergency Drugs available, Suction available, Patient being monitored and Timeout performed Patient Re-evaluated:Patient Re-evaluated prior to inductionOxygen Delivery Method: Circle system utilized Preoxygenation: Pre-oxygenation with 100% oxygen Intubation Type: IV induction Ventilation: Mask ventilation without difficulty and Oral airway inserted - appropriate to patient size Laryngoscope Size: Glidescope and 4 Grade View: Grade I Tube type: Oral Tube size: 7.5 mm Number of attempts: 1 Airway Equipment and Method: Stylet,  Video-laryngoscopy and LTA kit utilized Placement Confirmation: ETT inserted through vocal cords under direct vision,  positive ETCO2 and breath sounds checked- equal and bilateral Secured at: 22 cm Tube secured with: Tape Dental Injury: Teeth and Oropharynx as per pre-operative assessment

## 2016-08-16 NOTE — Op Note (Signed)
08/16/2016  4:07 PM  PATIENT:  Jeffrey Cooke  56 y.o. male with pain and weakness in the left upper extremity. He has a large disc herniation at C6/7 corresponding to his weakness. At C5/6 he is markedly spondylitic with foraminal compromise.Given his weakness in the triceps he is taken for operative decompression.   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  PROCEDURE:  Anterior Cervical decompression C5/6,6/7 Arthrodesis C5-7 with 7mm structural allograft C5/6, 8mm allograft structural 6/7 Anterior instrumentation(Nuvasive helix r) C5-7  SURGEON:   Surgeon(s): Coletta MemosKyle Romie Tay, MD Tressie StalkerJeffrey Jenkins, MD   ASSISTANTS:Jenkins, Tinnie GensJeffrey  ANESTHESIA:   general  EBL:  Total I/O In: 1800 [I.V.:1800] Out: 75 [Blood:75]  BLOOD ADMINISTERED:none  CELL SAVER GIVEN:none  COUNT:per nursing  DRAINS: none   SPECIMEN:  No Specimen  DICTATION: Jeffrey Cooke was taken to the operating room, intubated, and placed under general anesthesia without difficulty. He was positioned supine with his head in slight extension on a horseshoe headrest. The neck was prepped and draped in a sterile manner. I infiltrated 4 cc's 1/2%lidocaine/1:200,000 strength epinephrine into the planned incision starting from the midline to the medial border of the left sternocleidomastoid muscle. I opened the incision with a 10 blade and dissected sharply through soft tissue to the platysma. I dissected in the plane superior to the platysma both rostrally and caudally. I then opened the platysma in a horizontal fashion with Metzenbaum scissors, and dissected in the inferior plane rostrally and caudally. With both blunt and sharp technique I created an avascular corridor to the cervical spine. I placed a spinal needle(s) in the disc space at 4/5,5/6,and  6/7 . I then reflected the longus colli from C5 to C7 and placed self retaining retractors. I opened the disc space(s) at 5/6,6/7 with a 15 blade. I removed disc with curettes, Kerrison punches, and the drill. Using the drill I removed osteophytes and prepared for the decompression.  I decompressed the spinal canal and the C6, and 7 root(s) with the drill, Kerrison punches, and the curettes. I used the microscope to aid in microdissection. I removed the posterior longitudinal ligament to fully expose and decompress the thecal sac. I exposed the roots laterally taking down the 5/6, and 6/7 uncovertebral joints. With the decompression complete I moved on to the arthrodesis. We used the drill to level the surfaces of C5,6,and 7. I removed soft tissue to prepare the disc space and the bony surfaces. I measured the space and placed a 7mm structural allograft into the disc space at 5/6, and an 8mm graft at 6/7.  We then placed the anterior instrumentation. I placed 2 screws in each vertebral body through the plate. I locked the screws into place. Intraoperative xray showed the graft, plate, and screws to be in good position. I irrigated the wound, achieved hemostasis, and closed the wound in layers. I approximated the platysma, and the subcuticular plane with vicryl sutures. I used Dermabond for a sterile dressing.   PLAN OF CARE: Admit for overnight observation  PATIENT DISPOSITION:  PACU - hemodynamically stable.   Delay start of Pharmacological VTE agent (>24hrs) due to surgical blood loss or risk of bleeding:  yes

## 2016-08-16 NOTE — H&P (Signed)
Jeffrey Cooke is an 56 y.o. male.   Chief Complaint: left upper extremity pain, weakness,  HPI: Jeffrey Cooke is a 56 y.o. male Whom presented with the acute onset of pain in the left upper extremity along with weakness. MRI shows a large disc herniation at C6/7, and osteophytic foraminal compression at C5/6 on the left. Given his weakness he has decided to undergo an ACDF at those two levels.  Past Medical History:  Diagnosis Date  . Anxiety   . Arthritis    hands and shoulders  . Depression   . GERD (gastroesophageal reflux disease)   . Hypertension   . OA (osteoarthritis of spine)    with radiculopathy  . 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 BACK SURGERY    . COLONOSCOPY    . HERNIA REPAIR    . ULNAR NERVE TRANSPOSITION Left 04/05/2013   Procedure: LEFT ULNAR NEUROPLASTY AT ELBOW ;  Surgeon: Jolyn Nap, MD;  Location: [facility];  Service: Orthopedics;  Laterality: Left;  . WISDOM TOOTH EXTRACTION      Family History  Problem Relation Age of Onset  . Heart disease Mother   . Cancer Mother    Social History:  reports that he has never smoked. His smokeless tobacco use includes Snuff. He reports that he drinks alcohol. He reports that he does not use drugs.  Allergies: No Known Allergies  Medications Prior to Admission  Medication Sig Dispense Refill  . amLODipine (NORVASC) 10 MG tablet Take 10 mg by mouth daily.    . cyclobenzaprine (FLEXERIL) 10 MG tablet Take 10 mg by mouth 3 (three) times daily.    Marland Kitchen esomeprazole (NEXIUM) 40 MG capsule Take 40 mg by mouth daily.     Marland Kitchen lisinopril (PRINIVIL,ZESTRIL) 20 MG tablet Take 20 mg by mouth daily.    . vitamin C (ASCORBIC ACID) 500 MG tablet Take 500 mg by mouth daily.      Results for orders placed or performed during the hospital encounter of 08/16/16 (from the past 48 hour(s))  Type and screen All Cardiac and thoracic surgeries, spinal fusions,  myomectomies, craniotomies, colon & liver resections, total joint revisions, same day c-section with placenta previa or accreta.     Status: None   Collection Time: 08/16/16 10:30 AM  Result Value Ref Range   ABO/RH(D) B POS    Antibody Screen NEG    Sample Expiration 08/19/2016   ABO/Rh     Status: None   Collection Time: 08/16/16 10:30 AM  Result Value Ref Range   ABO/RH(D) B POS   CBC     Status: None   Collection Time: 08/16/16 10:41 AM  Result Value Ref Range   WBC 8.3 4.0 - 10.5 K/uL   RBC 5.02 4.22 - 5.81 MIL/uL   Hemoglobin 15.1 13.0 - 17.0 g/dL   HCT 46.0 39.0 - 52.0 %   MCV 91.6 78.0 - 100.0 fL   MCH 30.1 26.0 - 34.0 pg   MCHC 32.8 30.0 - 36.0 g/dL   RDW 12.6 11.5 - 15.5 %   Platelets 318 150 - 400 K/uL  Basic metabolic panel     Status: None   Collection Time: 08/16/16 10:41 AM  Result Value Ref Range   Sodium 138 135 - 145 mmol/L   Potassium 4.0 3.5 - 5.1 mmol/L   Chloride 105 101 - 111 mmol/L   CO2 24 22 -  32 mmol/L   Glucose, Bld 90 65 - 99 mg/dL   BUN 10 6 - 20 mg/dL   Creatinine, Ser 0.79 0.61 - 1.24 mg/dL   Calcium 9.0 8.9 - 10.3 mg/dL   GFR calc non Af Amer >60 >60 mL/min   GFR calc Af Amer >60 >60 mL/min    Comment: (NOTE) The eGFR has been calculated using the CKD EPI equation. This calculation has not been validated in all clinical situations. eGFR's persistently <60 mL/min signify possible Chronic Kidney Disease.    Anion gap 9 5 - 15   No results found.  Review of Systems  HENT: Negative.   Eyes: Negative.   Respiratory: Negative.   Cardiovascular: Negative.   Gastrointestinal: Negative.   Genitourinary: Negative.   Musculoskeletal: Positive for neck pain.  Skin: Negative.   Neurological: Positive for sensory change, focal weakness and weakness.  Psychiatric/Behavioral: Negative.     Blood pressure (!) 152/98, pulse 82, temperature 98.3 F (36.8 C), temperature source Oral, resp. rate 20, SpO2 99 %. Physical Exam  Constitutional: He  is oriented to person, place, and time. He appears well-developed and well-nourished. He appears distressed.  HENT:  Head: Normocephalic and atraumatic.  Right Ear: External ear normal.  Left Ear: External ear normal.  Nose: Nose normal.  Mouth/Throat: Oropharynx is clear and moist.  Eyes: Conjunctivae and EOM are normal. Pupils are equal, round, and reactive to light.  Neck: Neck supple.  Restricted turning towards left  Cardiovascular: Normal rate, regular rhythm, normal heart sounds and intact distal pulses.   Respiratory: Breath sounds normal.  GI: Soft. Bowel sounds are normal.  Neurological: He is alert and oriented to person, place, and time. He displays normal reflexes. No cranial nerve deficit. He exhibits normal muscle tone. He displays a negative Romberg sign. Coordination and gait normal. He displays no Babinski's sign on the right side. He displays no Babinski's sign on the left side.  Weakness left triceps  Skin: Skin is warm and dry.  Psychiatric: He has a normal mood and affect. His behavior is normal. Thought content normal.     Assessment/Plan OR for acdf at C5/6,6/7.BP (!) 152/98   Pulse 82   Temp 98.3 F (36.8 C) (Oral)   Resp 20   SpO2 99%  [name] has decided to undergo an anterior cervical decompression and arthrodesis for radiculopathy and weakness in the left upper extremity at levels 5/6,6/7. Risks and benefits including but not limited to bleeding, infection, paralysis, weakness in one or both extremities, bowel and/or bladder dysfunction, fusion failure, hardware failure, need for further surgery, no relief of pain. He understands and wishes to proceed.  [name], MD 08/16/2016, 12:25 PM

## 2016-08-16 NOTE — Discharge Summary (Signed)
Physician Discharge Summary  Patient ID: Jeffrey Cooke MRN: [id number] DOB/AGE: 11-20-1960 56 y.o.  Admit date: 08/16/2016 Discharge date: 08/16/2016  Admission Diagnoses:HNP Cervical spine C5/6, 6/7  Discharge Diagnoses: same Active Problems:   HNP (herniated nucleus pulposus), cervical   Discharged Condition: good  Hospital Course: [name] was admitted and taken to the operating room for an uncomplicated ACDF at 5/6, and 6/7. Post op he is voiding, ambulating, and tolerating a regular diet His wound is clean, flat, dry, without signs of infection.Speaking voice is normal  Treatments: surgery: Anterior Cervical decompression C5/6,6/7 Arthrodesis C5-7 with 7mm structural allograft C5/6, 8mm allograft structural 6/7 Anterior instrumentation(Nuvasive helix r) C5-7   Discharge Exam: Blood pressure (!) 127/93, pulse 87, temperature 98.2 F (36.8 C), resp. rate 13, SpO2 93 %. General appearance: alert, cooperative, appears stated age, no distress and mild distress Neurologic: Motor: weakness in left triceps 4/5  Disposition: 01-Home or Self Care OSTEOARTHRITIS OF SPINE WITH RADICULOPATHY  Allergies as of 08/16/2016   No Known Allergies     Medication List    TAKE these medications   amLODipine 10 MG tablet Commonly known as:  NORVASC Take 10 mg by mouth daily.   cyclobenzaprine 10 MG tablet Commonly known as:  FLEXERIL Take 10 mg by mouth 3 (three) times daily.   esomeprazole 40 MG capsule Commonly known as:  NEXIUM Take 40 mg by mouth daily.   HYDROcodone-acetaminophen 5-325 MG tablet Commonly known as:  NORCO/VICODIN Take 1 tablet by mouth every 6 (six) hours as needed for moderate pain.   lisinopril 20 MG tablet Commonly known as:  PRINIVIL,ZESTRIL Take 20 mg by mouth daily.   vitamin C 500 MG tablet Commonly known as:  ASCORBIC ACID Take 500 mg by mouth daily.      Follow-up Information    Brytney Somes L, MD Follow up.   Specialty:   Neurosurgery Contact information: [address] [place] [address] [phone]           Signed: Carmela HurtCABBELL,Allycia Pitz L 08/16/2016, 5:43 PM

## 2016-08-19 ENCOUNTER — Encounter (HOSPITAL_COMMUNITY): Payer: Self-pay | Admitting: Neurosurgery

## 2016-09-10 ENCOUNTER — Encounter (HOSPITAL_COMMUNITY): Payer: Self-pay | Admitting: Neurosurgery

## 2018-01-04 IMAGING — CR DG CERVICAL SPINE 2 OR 3 VIEWS
1 series · 1 of 1 positions shown · non-contrast
Comparison: MRI cervical spine 08/15/2016.

CLINICAL DATA: Intraoperative localization images.

EXAM:
CERVICAL SPINE - 2-3 VIEW

[AP]
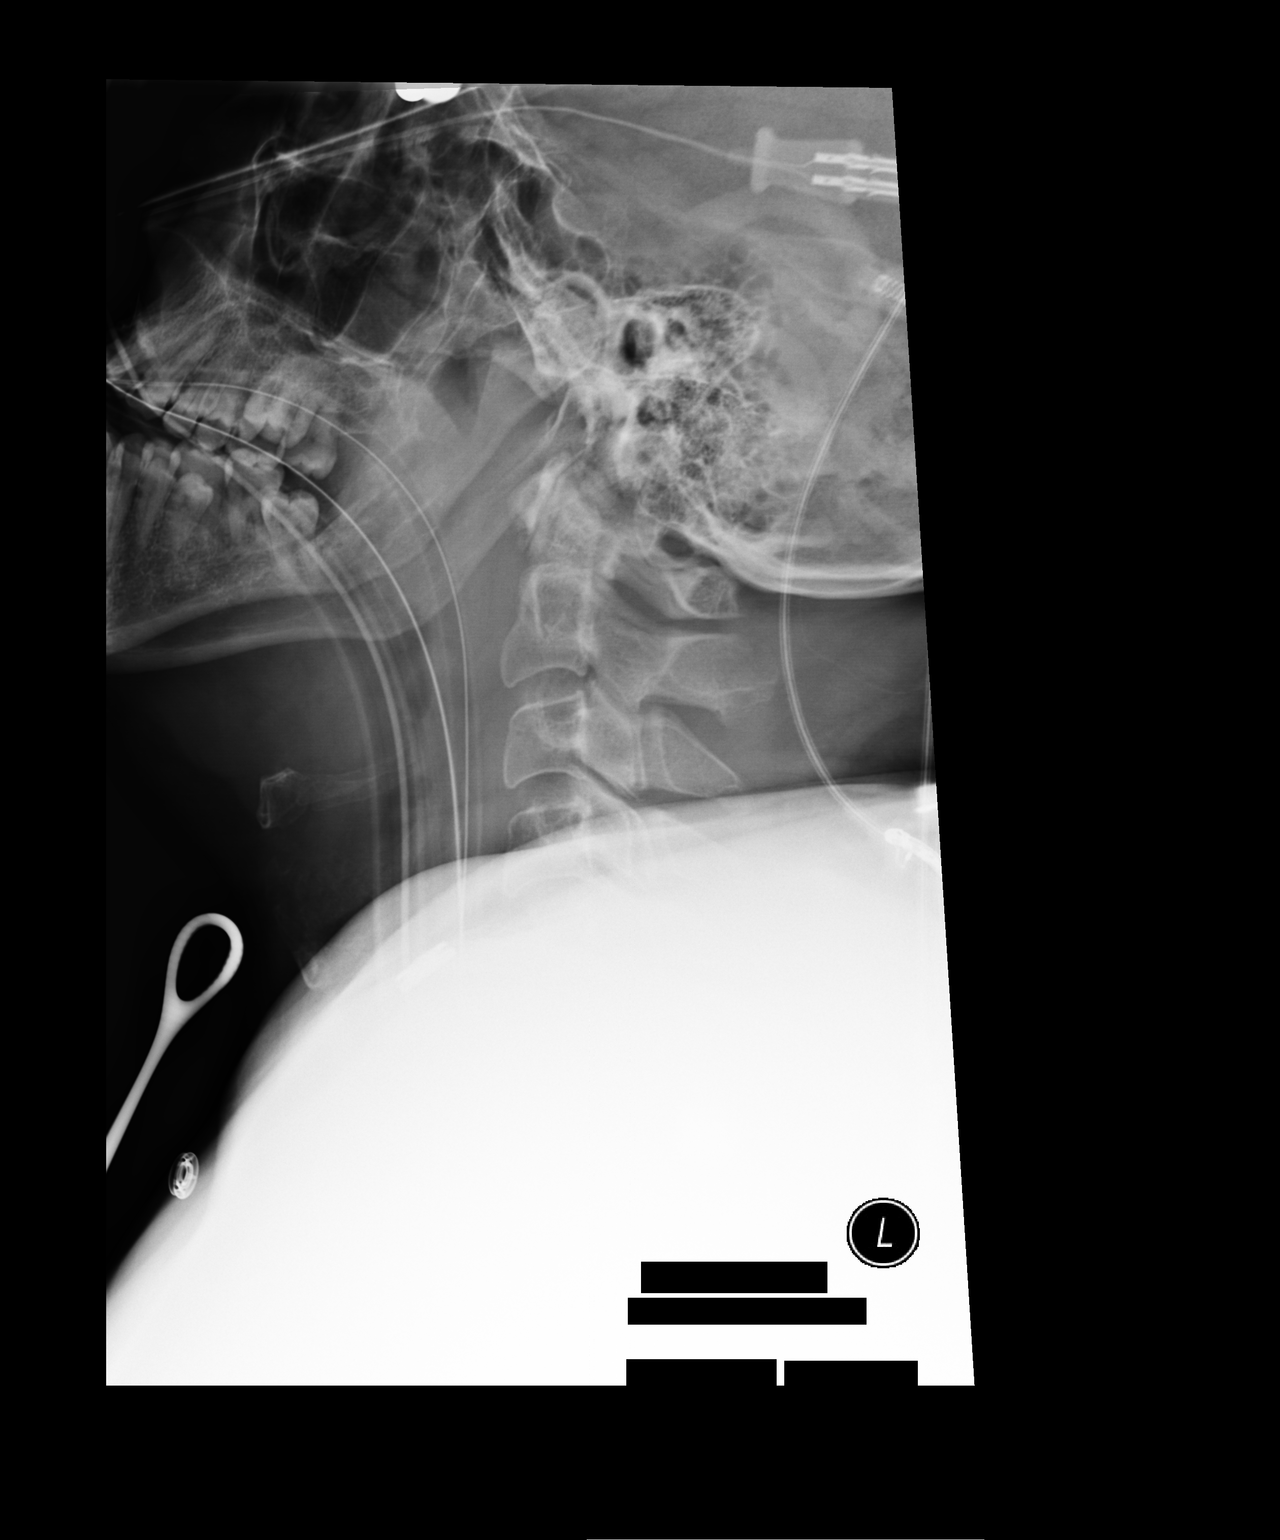

[1 of 1 positions shown; findings below may reference images not displayed]

FINDINGS: The first film demonstrates an instrument in the anterior neck soft
tissues. The second film demonstrates needles at C4-5, C5-6 and
C6-7. The third film shows a cervical plate extending caudally from
C5 but further detail is obscured.
IMPRESSION: Intraoperative films as described.
